# Patient Record
Sex: Male | Born: 1980 | Hispanic: Yes | Marital: Single | Smoking: Current every day smoker
Health system: Southern US, Community
[De-identification: ages and names within clinical notes are randomized; demographics above are authoritative.]

---

## 2020-05-12 ENCOUNTER — Emergency Department (HOSPITAL_COMMUNITY)
Admission: EM | Admit: 2020-05-12 | Discharge: 2020-05-13 | Disposition: A | Discharge status: Home / Self Care | Payer: Managed Care, Other (non HMO) | Attending: Emergency Medicine | Admitting: Emergency Medicine

## 2020-05-12 ENCOUNTER — Encounter (HOSPITAL_COMMUNITY): Payer: Self-pay | Admitting: *Deleted

## 2020-05-12 ENCOUNTER — Other Ambulatory Visit: Payer: Self-pay

## 2020-05-12 DIAGNOSIS — F17210 Nicotine dependence, cigarettes, uncomplicated: Secondary | ICD-10-CM | POA: Insufficient documentation

## 2020-05-12 DIAGNOSIS — Z79899 Other long term (current) drug therapy: Secondary | ICD-10-CM | POA: Insufficient documentation

## 2020-05-12 DIAGNOSIS — R519 Headache, unspecified: Secondary | ICD-10-CM

## 2020-05-12 DIAGNOSIS — J029 Acute pharyngitis, unspecified: Secondary | ICD-10-CM

## 2020-05-12 DIAGNOSIS — J019 Acute sinusitis, unspecified: Secondary | ICD-10-CM | POA: Diagnosis not present

## 2020-05-12 NOTE — ED Triage Notes (Signed)
Headache  Throat and ear pain for 3 weeks no known temp  He has been seen in [place] and was given some meds  No better

## 2020-05-13 ENCOUNTER — Emergency Department (HOSPITAL_COMMUNITY): Payer: Managed Care, Other (non HMO)

## 2020-05-13 MED ORDER — AMOXICILLIN-POT CLAVULANATE 875-125 MG PO TABS: 1.0000 | ORAL_TABLET | Freq: Two times a day (BID) | ORAL | 0 refills | Status: AC

## 2020-05-13 MED ORDER — FLUTICASONE PROPIONATE 50 MCG/ACT NA SUSP: 1.0000 | Freq: Every day | NASAL | 2 refills | Status: AC

## 2020-05-13 MED ORDER — PROCHLORPERAZINE EDISYLATE 10 MG/2ML IJ SOLN
10.0000 mg | Freq: Once | INTRAMUSCULAR | Status: AC
  Administered 2020-05-13: 10 mg via INTRAVENOUS
  Filled 2020-05-13: qty 2

## 2020-05-13 MED ORDER — DIPHENHYDRAMINE HCL 50 MG/ML IJ SOLN
25.0000 mg | Freq: Once | INTRAMUSCULAR | Status: AC
  Administered 2020-05-13: 25 mg via INTRAVENOUS
  Filled 2020-05-13: qty 1

## 2020-05-13 MED ORDER — SODIUM CHLORIDE 0.9 % IV BOLUS
1000.0000 mL | Freq: Once | INTRAVENOUS | Status: AC
  Administered 2020-05-13: 1000 mL via INTRAVENOUS

## 2020-05-13 NOTE — ED Notes (Signed)
Ears irrigated bilaterally with normal saline until no more cerumen came with the saline. Ear canals were visually more clear following irrigation. Pt tolerated well.

## 2020-05-13 NOTE — ED Provider Notes (Signed)
[facility] EMERGENCY DEPARTMENT Provider Note   CSN: [id number] Arrival date & time: 05/12/20  2314     History Chief Complaint  Patient presents with  . Headache    Colin Higgins is a 39 y.o. male who presents for evaluation of persistent headache, sore throat, ear fullness.  He states is been going on for about 2 to 3 weeks.  He reports that initially at onset of symptoms, he went to a clinic in [place].  He was given amoxicillin for throat infection as well as HCTZ and enalapril.  He states that despite taking these medications, he does not feel like symptoms have improved.  He states that he does not get headaches frequently.  He reports currently the headache is a 5/10 and feels like it is a throbbing headache.  He states that sometimes it is in the front and sometimes is in the back.  No preceding trauma, injury, fall.  He also reports that he still has pain in his face as well as his throat.  He has been able to tolerate secretions and p.o. without any difficulty.  He sometimes will take Aleve and that temporarily helps his symptoms but that they return.  He states this morning, he developed a slight cough.  He denies any fever, rhinorrhea, congestion, numbness/weakness of his arms or legs, difficulty breathing, vomiting.  The history is provided by the patient.       History reviewed. No pertinent past medical history.  There are no problems to display for this patient.   History reviewed. No pertinent surgical history.     No family history on file.  Social History   Tobacco Use  . Smoking status: Current Every Day Smoker  . Smokeless tobacco: Never Used  Substance Use Topics  . Alcohol use: Yes  . Drug use: Not on file    Home Medications Prior to Admission medications   Medication Sig Start Date End Date Taking? Authorizing Provider  acetaminophen (TYLENOL) 500 MG tablet Take 1,000 mg by mouth every 6 (six) hours as needed for mild pain or  headache.   Yes [provider]  enalapril (VASOTEC) 20 MG tablet Take 20 mg by mouth daily.   Yes [provider]  ibuprofen (ADVIL) 200 MG tablet Take 400 mg by mouth every 6 (six) hours as needed for moderate pain.   Yes [provider]  methimazole (TAPAZOLE) 10 MG tablet Take 10 mg by mouth daily.   Yes [provider]  triamterene-hydrochlorothiazide (MAXZIDE-25) 37.5-25 MG tablet Take 1 tablet by mouth daily.   Yes [provider]  amoxicillin-clavulanate (AUGMENTIN) 875-125 MG tablet Take 1 tablet by mouth every 12 (twelve) hours. 05/13/20   Maxwell Caul, PA-C  fluticasone (FLONASE) 50 MCG/ACT nasal spray Place 1 spray into both nostrils daily. 05/13/20   Maxwell Caul, PA-C    Allergies    Patient has no known allergies.  Review of Systems   Review of Systems  Constitutional: Negative for fever.  HENT: Positive for sore throat.   Respiratory: Negative for cough and shortness of breath.   Cardiovascular: Negative for chest pain.  Gastrointestinal: Negative for abdominal pain, nausea and vomiting.  Genitourinary: Negative for dysuria and hematuria.  Neurological: Positive for headaches. Negative for weakness and numbness.  All other systems reviewed and are negative.   Physical Exam Updated Vital Signs BP (!) 143/87   Pulse 89   Temp 98.3 F (36.8 C) (Oral)   Resp  16   Wt 124.3 kg   SpO2 95%   Physical Exam Vitals and nursing note reviewed.  Constitutional:      Appearance: Normal appearance. He is well-developed.  HENT:     Head: Normocephalic and atraumatic.     Ears:     Comments: No tenderness, warmth, erythema noted to mastoid process bilaterally.  Unable to visualize TM secondary to cerumen impaction.  Reevaluation after cerumen removal shows good visualization of TMs.  There is still some remaining cerumen in the ear canal but TMs do not appear erythematous, bulging bilaterally.    Nose:     Right Sinus:  Maxillary sinus tenderness present.     Left Sinus: Maxillary sinus tenderness present.     Comments: Tenderness palpation in maxillary sinuses bilaterally.    Mouth/Throat:     Pharynx: Posterior oropharyngeal erythema present.     Comments: Airways patent, phonation is intact.  Uvula is midline.  Posterior oropharynx is slightly erythematous.  No exudates, edema. Eyes:     General: Lids are normal.     Conjunctiva/sclera: Conjunctivae normal.     Pupils: Pupils are equal, round, and reactive to light.  Neck:     Comments: Neck is supple and without rigidity. Cardiovascular:     Rate and Rhythm: Normal rate and regular rhythm.     Pulses: Normal pulses.     Heart sounds: Normal heart sounds. No murmur heard.  No friction rub. No gallop.   Pulmonary:     Effort: Pulmonary effort is normal.     Breath sounds: Normal breath sounds.     Comments: Lungs clear to auscultation bilaterally.  Symmetric chest rise.  No wheezing, rales, rhonchi. Abdominal:     Palpations: Abdomen is soft. Abdomen is not rigid.     Tenderness: There is no abdominal tenderness. There is no guarding.  Musculoskeletal:        General: Normal range of motion.     Cervical back: Full passive range of motion without pain.  Lymphadenopathy:     Cervical: No cervical adenopathy.  Skin:    General: Skin is warm and dry.     Capillary Refill: Capillary refill takes less than 2 seconds.  Neurological:     Mental Status: He is alert and oriented to person, place, and time.     Comments: Cranial nerves III-XII intact Follows commands, Moves all extremities  5/5 strength to BUE and BLE  Sensation intact throughout all major nerve distributions No slurred speech. No facial droop.   Psychiatric:        Speech: Speech normal.     ED Results / Procedures / Treatments   Labs (all labs ordered are listed, but only abnormal results are displayed) Labs Reviewed - No data to display  EKG None  Radiology CT Head Wo  Contrast  Result Date: 05/13/2020 CLINICAL DATA:  Headache with throat and ear pain EXAM: CT HEAD WITHOUT CONTRAST TECHNIQUE: Contiguous axial images were obtained from the base of the skull through the vertex without intravenous contrast. COMPARISON:  None. FINDINGS: Brain: No evidence of acute infarction, hemorrhage, hydrocephalus, extra-axial collection or mass lesion/mass effect. Vascular: No hyperdense vessel or unexpected calcification. Skull: Reticulation along the left parietal scalp attributed to scarring. No osseous abnormality. Sinuses/Orbits: Clear sinuses. No mastoid or middle ear opacification. IMPRESSION: Negative head CT. Electronically Signed   By: Marnee Spring M.D.   On: 05/13/2020 07:54    Procedures Procedures (including critical care time)  Medications Ordered  in ED Medications  prochlorperazine (COMPAZINE) injection 10 mg (10 mg Intravenous Given 05/13/20 0735)  sodium chloride 0.9 % bolus 1,000 mL (0 mLs Intravenous Stopped 05/13/20 1020)  diphenhydrAMINE (BENADRYL) injection 25 mg (25 mg Intravenous Given 05/13/20 0736)    ED Course  I have reviewed the triage vital signs and the nursing notes.  Pertinent labs & imaging results that were available during my care of the patient were reviewed by me and considered in my medical decision making (see chart for details).    MDM Rules/Calculators/A&P                          38 year old male who presents for evaluation of headache, sore throat that has been persistent for the last 2 to 3 weeks.  Seen at clinic in [place] given amoxicillin, HCTZ, Enalapril.  Still having symptoms despite medication.  He does not frequently get headaches.  On initially arrival, he is afebrile, nontoxic-appearing.  Vital signs are stable.  Is slightly hypertensive.  On exam, he does have some erythema noted to his throat but no exudates.  History/physical exam concerning for peritonsillar abscess, Ludwig angina.  He is tolerating his secretions  without any drooling.  Additionally, on exam, he has no neuro deficits.  Consider sinusitis versus tonsillitis.  History/physical exam is not concerning for CVA, dural venous thrombosis meningitis.  Will give migraine cocktail and reassess.  Additionally, patient reports he has no prior history of headaches.  We will plan to get a CT head to ensure that there is no intracranial mass.  CT head negative for any acute abnormalities.  Discussed results with patient.  He reports that his ears feel better after irrigation.  Repeat examination shows improvement with no signs of active infection.  He does report his headache is better.  At this time, we will plan to treat a sinusitis given maxillary sinus tenderness, pressure as well as associated symptoms.  We will plan to start patient on Augmentin.  Patient with no known drug allergies. At this time, patient exhibits no emergent life-threatening condition that require further evaluation in ED or admission. Patient had ample opportunity for questions and discussion. All patient's questions were answered with full understanding. Strict return precautions discussed. Patient expresses understanding and agreement to plan.   Portions of this note were generated with [employer]. Dictation errors may occur despite best attempts at proofreading.   Final Clinical Impression(s) / ED Diagnoses Final diagnoses:  Acute sinusitis, recurrence not specified, unspecified location  Acute nonintractable headache, unspecified headache type  Sore throat    Rx / DC Orders ED Discharge Orders         Ordered    amoxicillin-clavulanate (AUGMENTIN) 875-125 MG tablet  Every 12 hours     Discontinue  Reprint     05/13/20 1007    fluticasone (FLONASE) 50 MCG/ACT nasal spray  Daily     Discontinue  Reprint     05/13/20 1007           Volanda Napoleon, PA-C 05/13/20 1340    Orpah Greek, MD 05/14/20 [id number]

## 2020-05-13 NOTE — ED Notes (Signed)
Patient transported to CT 

## 2020-05-13 NOTE — Discharge Instructions (Signed)
As we discussed, your CT scan looked good here.  We have plan to treat you with antibiotics for sinus infection.  Also use nasal spray as directed.  Follow-up with [facility] to establish a primary care doctor if you do not have one.   Return to emergency department for any worsening symptoms, headache, fever, difficulty swallowing, difficulty breathing, vomiting or any other worsening concerning symptoms.

## 2020-05-13 NOTE — ED Notes (Signed)
Patient c/o headache and fullness in both ears, states he was seen at a clinic in [place] for same  And given HCTZ and enalapril Maleate, states he is still having a headache.

## 2020-05-13 NOTE — ED Notes (Signed)
Patient verbalizes understanding of discharge instructions. Opportunity for questioning and answers were provided. Armband removed by staff, pt discharged from ED.  

## 2020-11-17 IMAGING — CT CT HEAD W/O CM
4 series · 16 of 47 positions shown, 18 images · non-contrast
Comparison: None.

CLINICAL DATA: Headache with throat and ear pain

EXAM:
CT HEAD WITHOUT CONTRAST
TECHNIQUE: Contiguous axial images were obtained from the base of the skull
through the vertex without intravenous contrast.

[Series 3: head wo · axial · 0.39mm/px · z∈[-141,-21]mm · 7 of 33 slices shown, 9 images]
[im 5/33  brain]
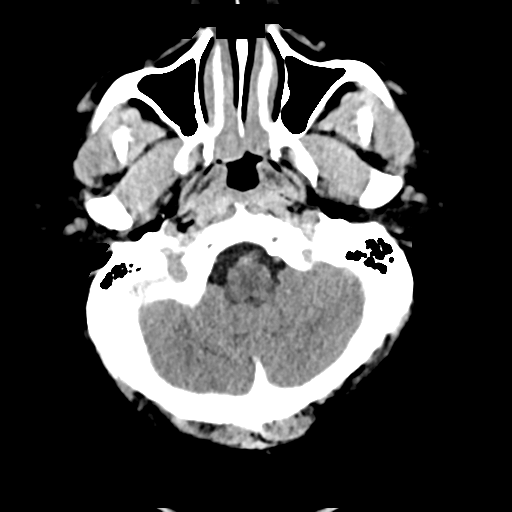
[im 5/33  bone]
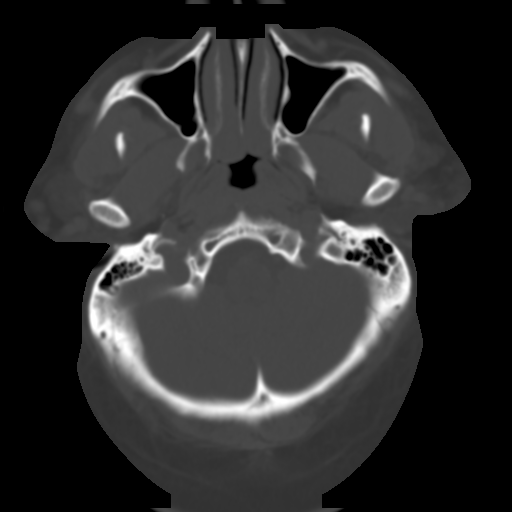
[im 9/33  brain]
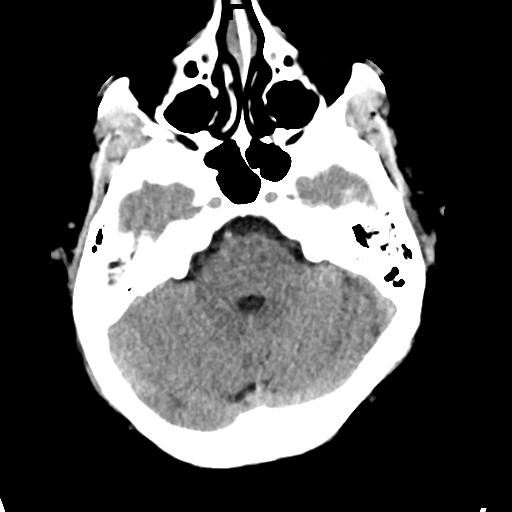
[im 13/33  brain]
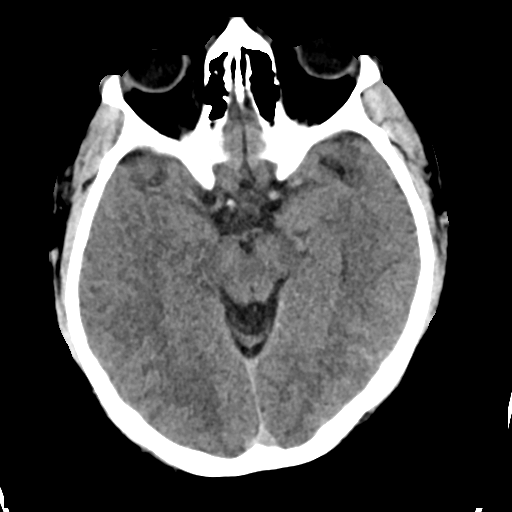
[im 17/33  brain]
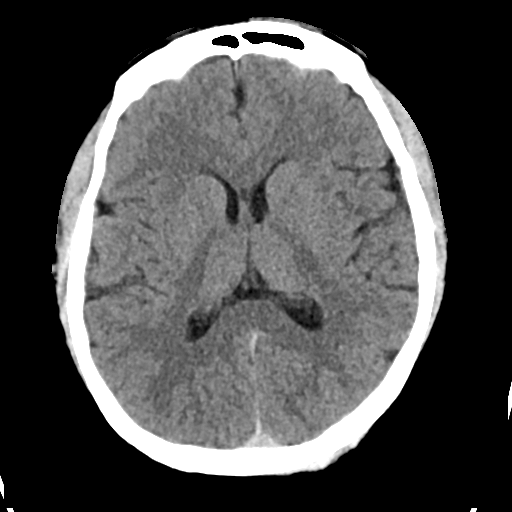
[im 21/33  brain]
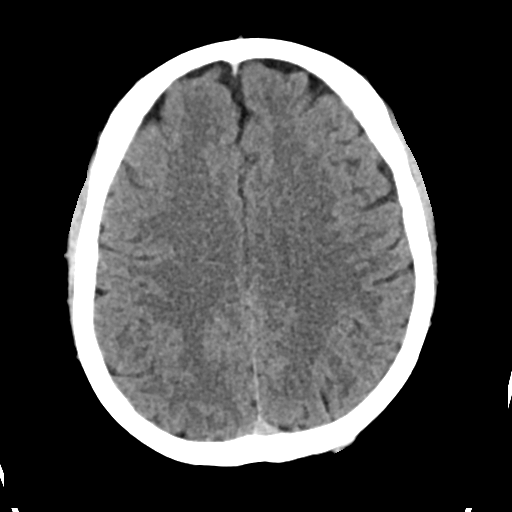
[im 21/33  bone]
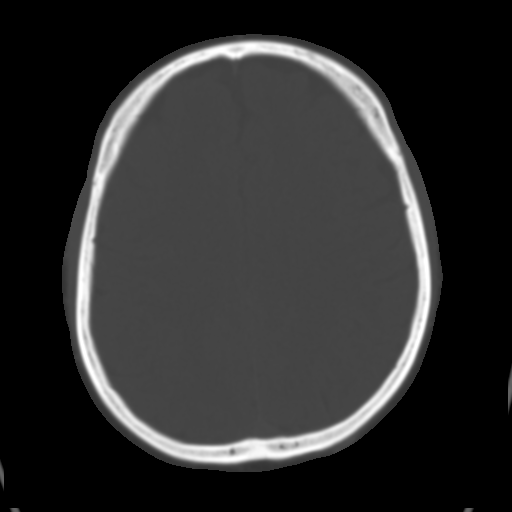
[im 25/33  brain]
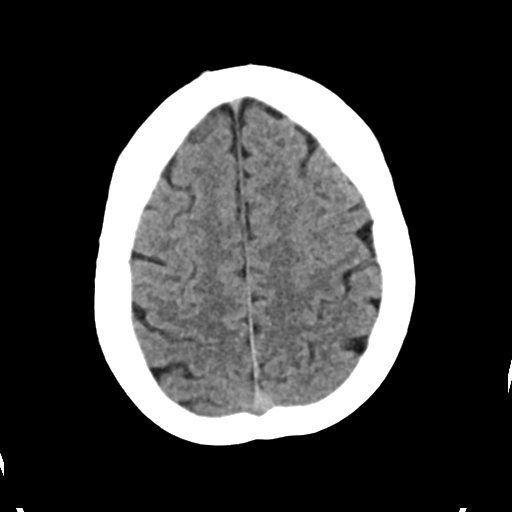
[im 29/33  brain]
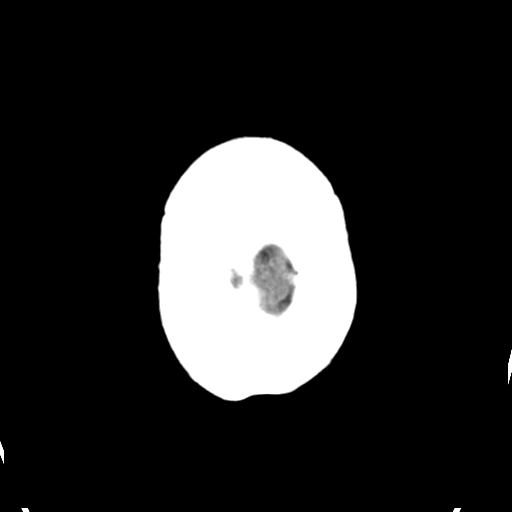

[Series 4: head bone · axial · 0.39mm/px · z∈[-145,-113]mm · 3 of 81 slices shown]
[im 9/81  bone]
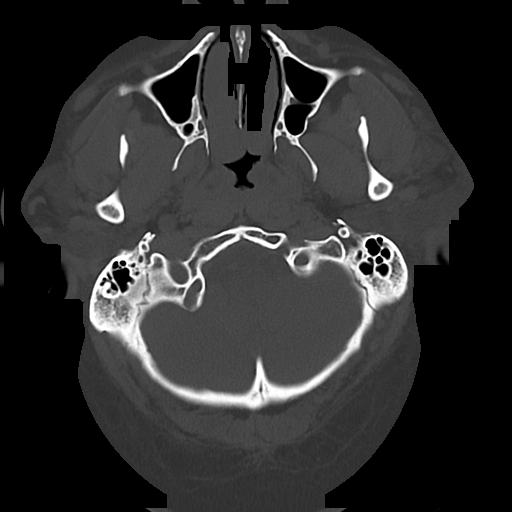
[im 17/81  bone]
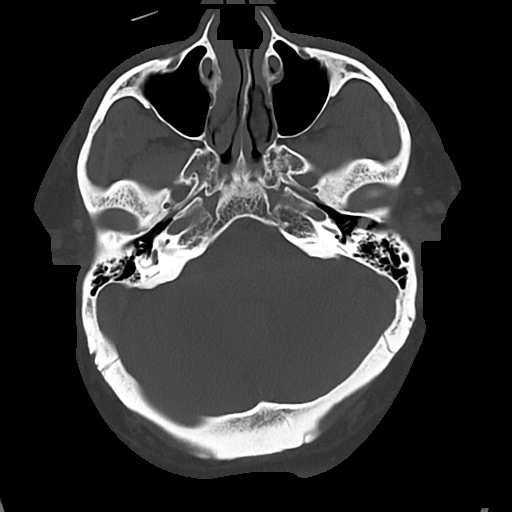
[im 25/81  bone]
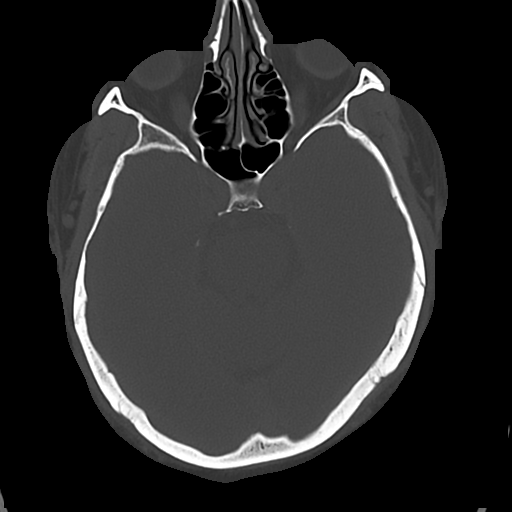

[Series 5: cor soft · coronal · 0.31mm/px · 3 of 68 slices shown]
[im 23/68  brain]
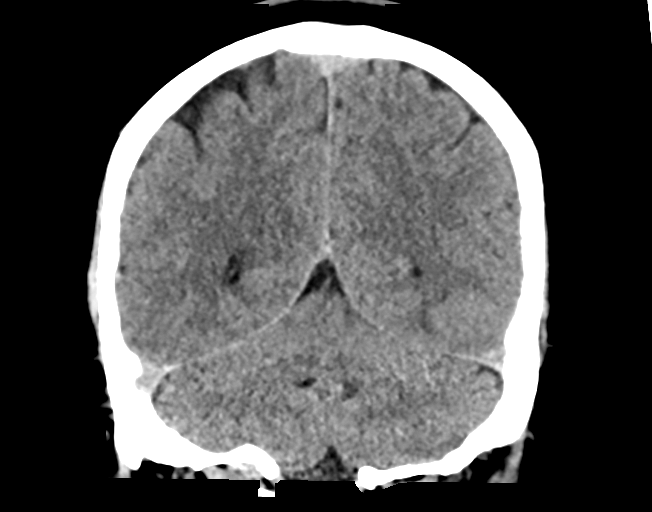
[im 30/68  brain]
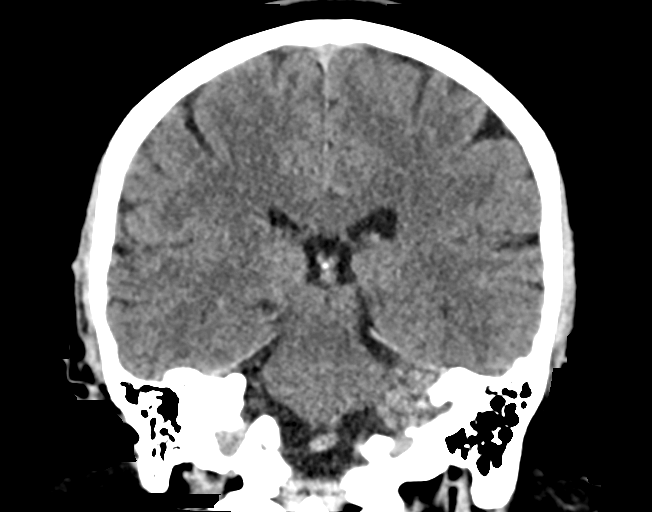
[im 38/68  brain]
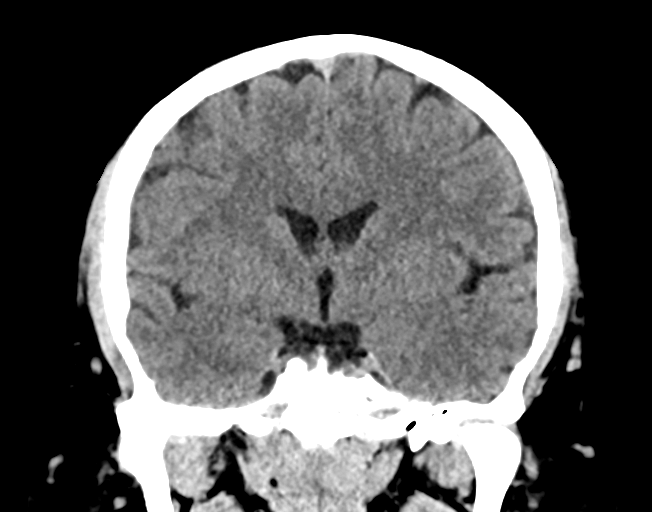

[Series 6: sag soft · sagittal · 0.32mm/px · 3 of 67 slices shown]
[im 23/67  brain]
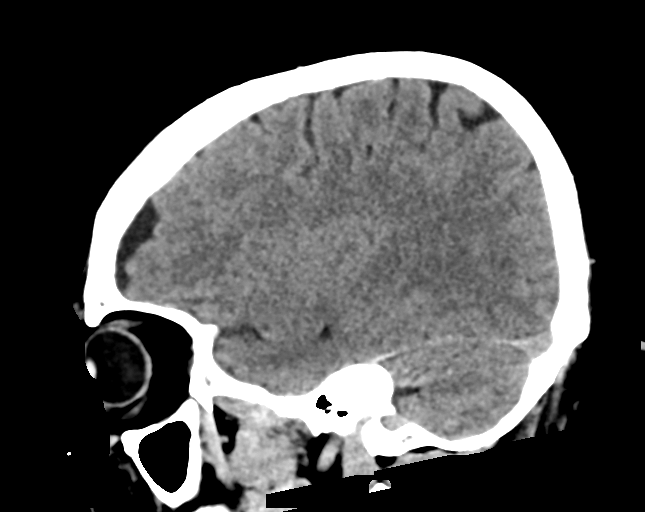
[im 34/67  brain]
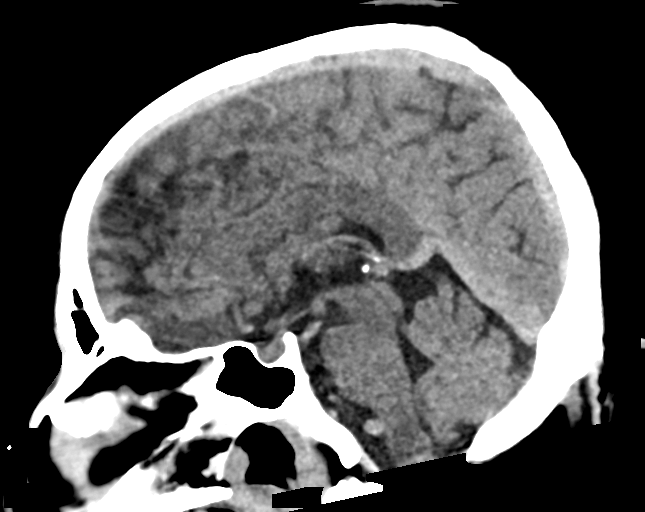
[im 45/67  brain]
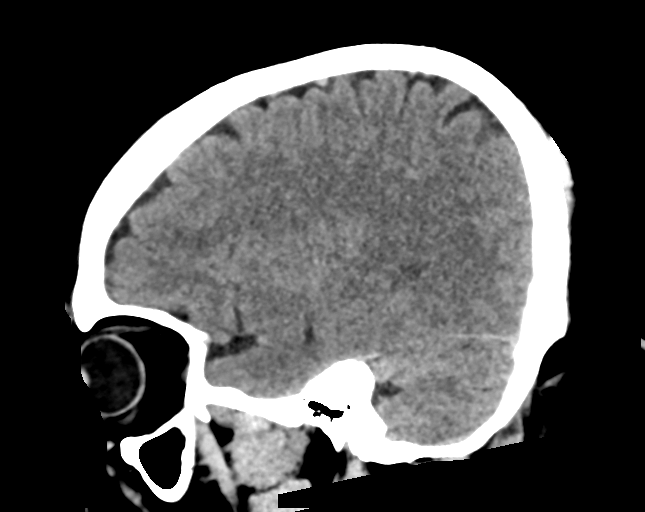

[16 of 47 positions shown; findings below may reference images not displayed]

FINDINGS: Brain: No evidence of acute infarction, hemorrhage, hydrocephalus,
extra-axial collection or mass lesion/mass effect.

Vascular: No hyperdense vessel or unexpected calcification.

Skull: Reticulation along the left parietal scalp attributed to
scarring. No osseous abnormality.

Sinuses/Orbits: Clear sinuses. No mastoid or middle ear
opacification.
IMPRESSION: Negative head CT.
